# Patient Record
Sex: Female | Born: 1960 | Race: White | Hispanic: No
Health system: Southern US, Community
[De-identification: ages and names within clinical notes are randomized; demographics above are authoritative.]

---

## 2008-08-15 ENCOUNTER — Encounter: Admission: RE | Admit: 2008-08-15 | Discharge: 2008-08-15 | Payer: Self-pay | Admitting: Unknown Physician Specialty

## 2017-08-09 ENCOUNTER — Other Ambulatory Visit: Payer: Self-pay | Admitting: Unknown Physician Specialty

## 2017-08-09 ENCOUNTER — Ambulatory Visit (INDEPENDENT_AMBULATORY_CARE_PROVIDER_SITE_OTHER): Payer: Managed Care, Other (non HMO)

## 2017-08-09 DIAGNOSIS — M25552 Pain in left hip: Secondary | ICD-10-CM | POA: Diagnosis not present

## 2017-08-09 DIAGNOSIS — M48061 Spinal stenosis, lumbar region without neurogenic claudication: Secondary | ICD-10-CM | POA: Diagnosis not present

## 2017-08-09 DIAGNOSIS — R52 Pain, unspecified: Secondary | ICD-10-CM

## 2019-11-25 IMAGING — DX DG HIP (WITH OR WITHOUT PELVIS) 2-3V*L*
3 series · 3 of 3 positions shown · non-contrast
Comparison: None in PACs

CLINICAL DATA: O awakened yesterday with left low back pain
radiating into the left hip and leg with no known injury.

EXAM:
DG HIP (WITH OR WITHOUT PELVIS) 2-3V LEFT

[pelvis ap]
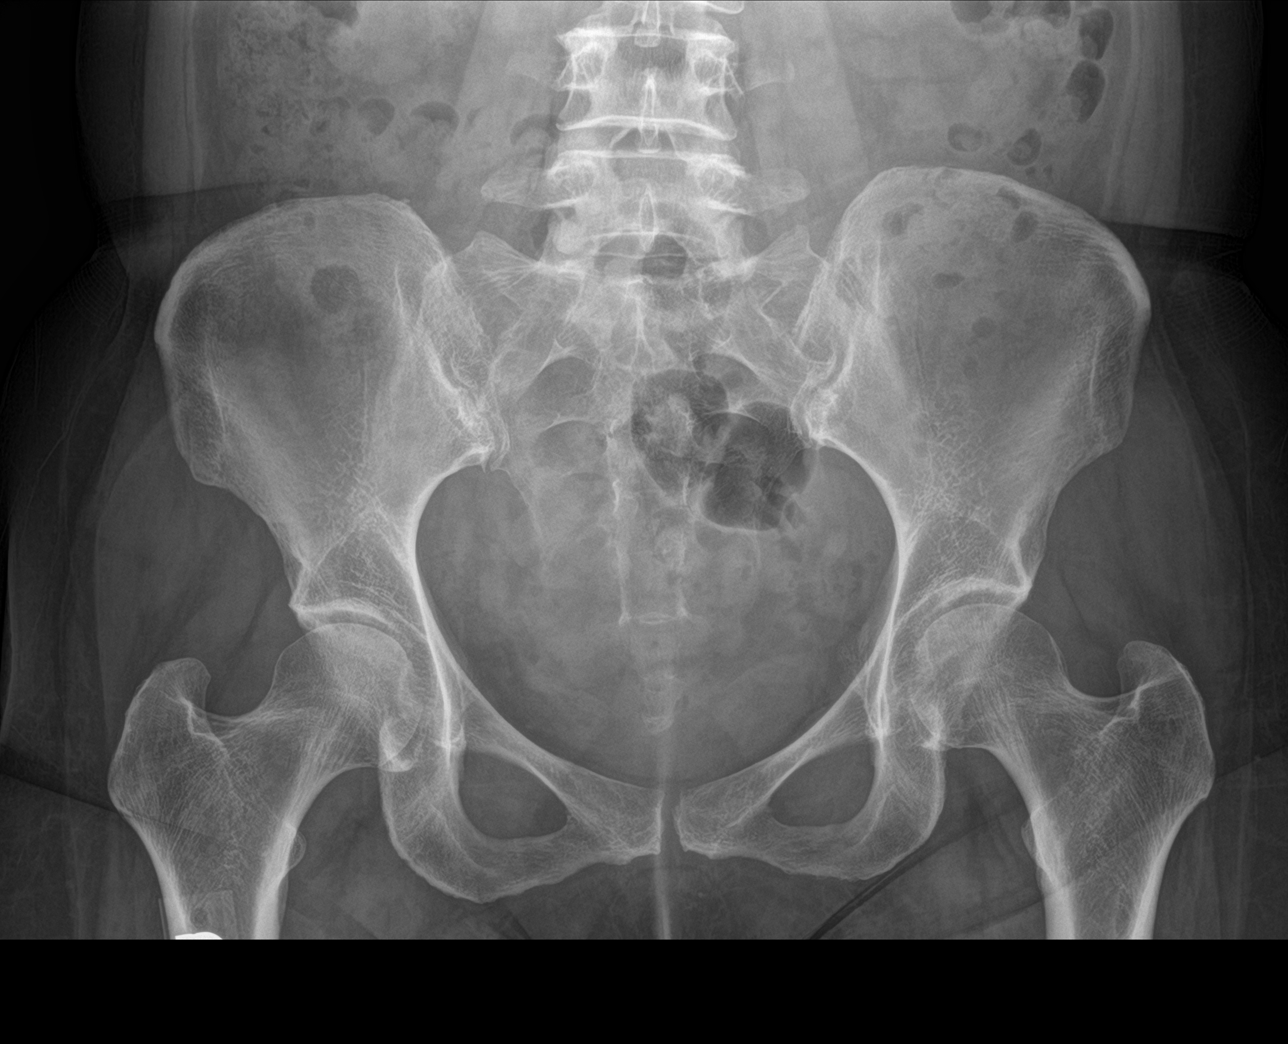

[hip ap]
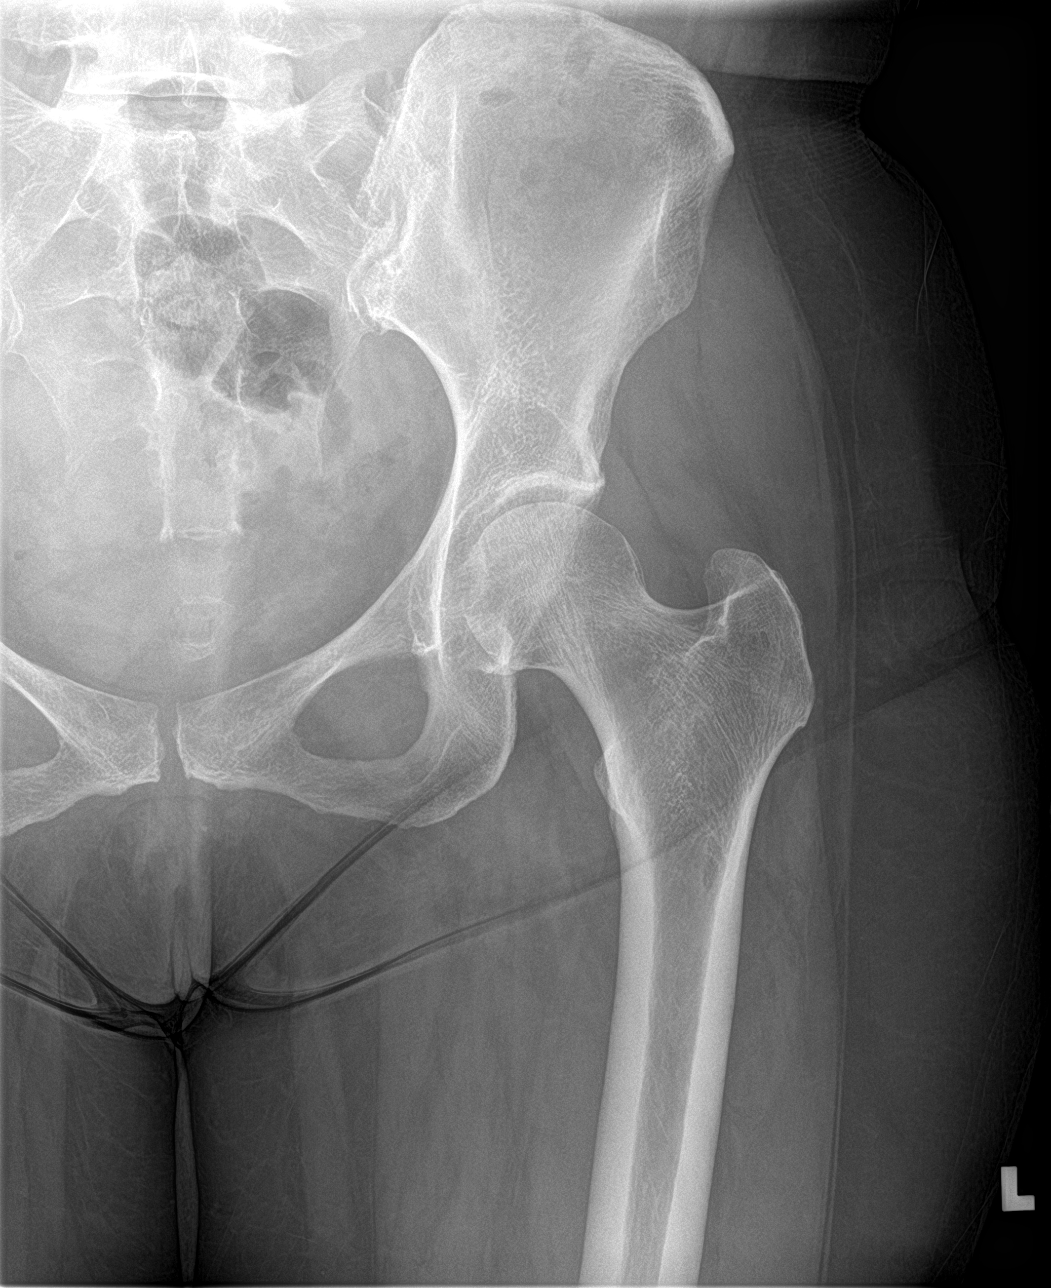

[hip lat]
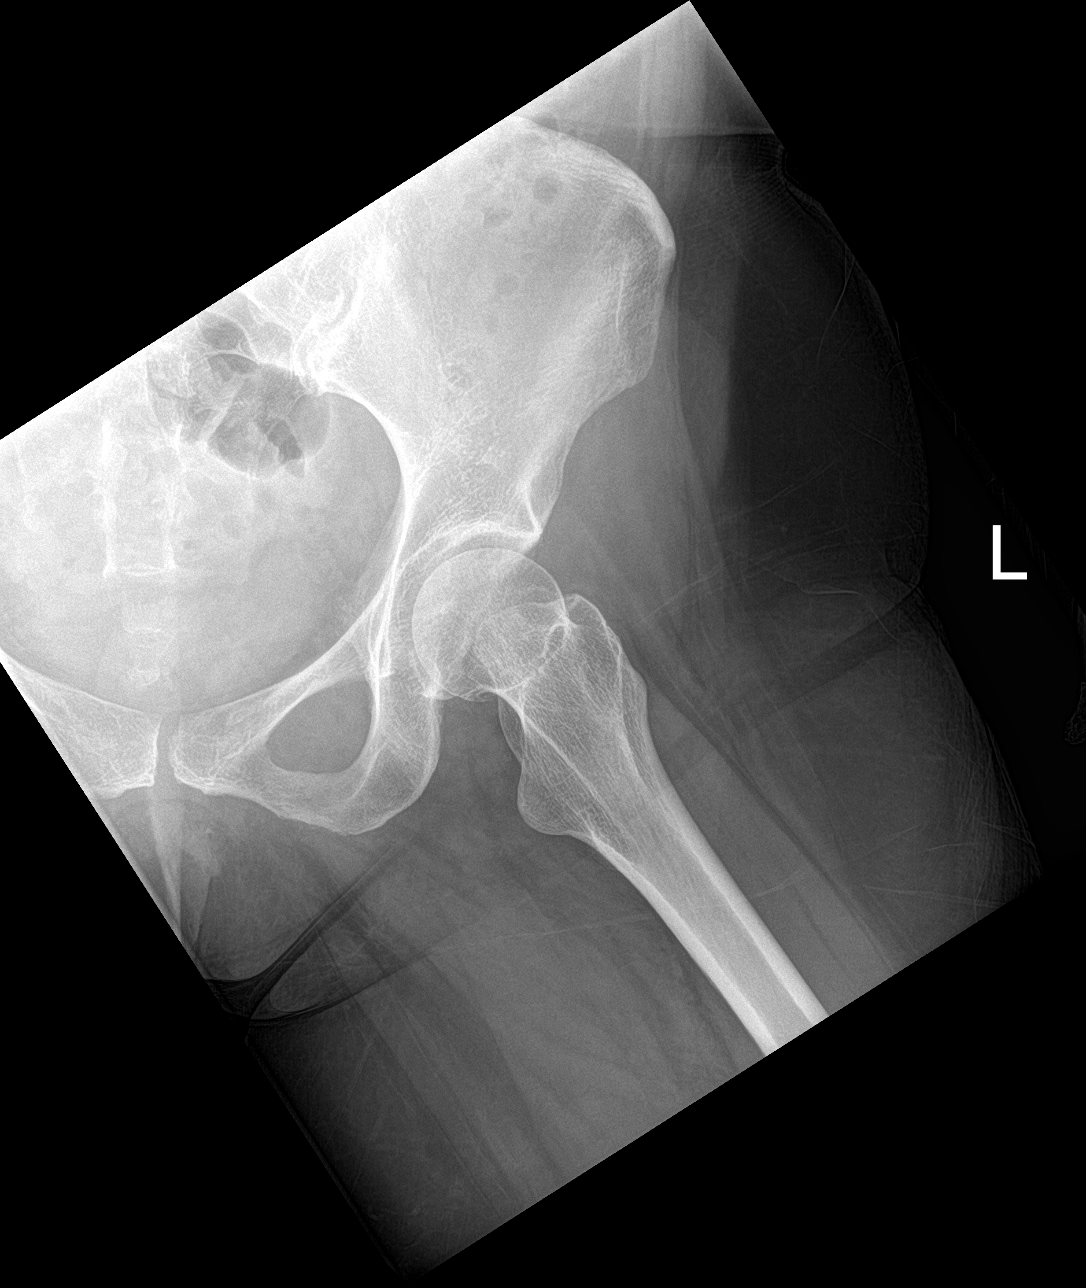

[3 of 3 positions shown; findings below may reference images not displayed]

FINDINGS: The bony pelvis is subjectively adequately mineralized. There is no
lytic nor blastic lesion. AP and lateral views of the left hip
reveal mild asymmetric narrowing of the joint space. The femoral
head and acetabulum remains smoothly rounded. The femoral neck,
intertrochanteric, and immediate subtrochanteric regions exhibit no
acute abnormalities.
IMPRESSION: Mild degenerative narrowing of the left hip joint space. No acute
bony abnormality.

## 2021-08-14 ENCOUNTER — Ambulatory Visit (INDEPENDENT_AMBULATORY_CARE_PROVIDER_SITE_OTHER): Payer: Managed Care, Other (non HMO) | Admitting: Podiatry

## 2021-08-14 ENCOUNTER — Other Ambulatory Visit: Payer: Self-pay

## 2021-08-14 ENCOUNTER — Encounter: Payer: Self-pay | Admitting: Podiatry

## 2021-08-14 DIAGNOSIS — Q828 Other specified congenital malformations of skin: Secondary | ICD-10-CM

## 2021-08-14 NOTE — Progress Notes (Signed)
?  Subjective:  ?Patient ID: Victoria Hawkins, female    DOB: [birth year],   MRN: [id number] ? ?Chief Complaint  ?Patient presents with  ? Callouses  ?  Possible callus on the bottom of left foot , patient states it in not painful unless walking on the floor bare foot  ? ? ?61 y.o. female presents for concern of a painful callus on her left foot that has been present about a month. Relates it is painful when walking barefoot. Denies redness or swelling. Denies treatment . Denies any other pedal complaints. Denies n/v/f/c.  ? ?History reviewed. No pertinent past medical history. ? ?Objective:  ?Physical Exam: ?Vascular: DP/PT pulses 2/4 bilateral. CFT <3 seconds. Normal hair growth on digits. No edema.  ?Skin. No lacerations or abrasions bilateral feet. Cored hyperkeratotic lesion noted sub third metatarsal on left foot.  ?Musculoskeletal: MMT 5/5 bilateral lower extremities in DF, PF, Inversion and Eversion. Deceased ROM in DF of ankle joint.  ?Neurological: Sensation intact to light touch.  ? ?Assessment:  ? ?1. Porokeratosis   ? ? ? ?Plan:  ?Patient was evaluated and treated and all questions answered. ?-Discussed corns and calluses with patient and treatment options.  ?-Hyperkeratotic tissue was debrided with chisel without incident.  ?-Applied salycylic acid treatment to area with dressing. Advised to remove bandaging tomorrow.  ?-Encouraged daily moisturizing ?-Discussed use of pumice stone ?-Advised good supportive shoes and inserts ?-Patient to return to office as needed or sooner if condition worsens. ? ? ?Louann Sjogren, DPM  ? ? ?
